# Patient Record
Sex: Male | Born: 1995 | Race: White | Hispanic: No | Marital: Single | Smoking: Never smoker
Health system: Southern US, Community
[De-identification: ages and names within clinical notes are randomized; demographics above are authoritative.]

## PROBLEM LIST (undated history)

## (undated) DIAGNOSIS — F319 Bipolar disorder, unspecified: Secondary | ICD-10-CM

## (undated) HISTORY — PX: APPENDECTOMY: SHX54

---
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
## 2018-03-02 ENCOUNTER — Emergency Department (HOSPITAL_COMMUNITY)
Admission: EM | Admit: 2018-03-02 | Discharge: 2018-03-03 | Disposition: A | Discharge status: Home / Self Care | Payer: BC Managed Care – PPO | Attending: Emergency Medicine | Admitting: Emergency Medicine

## 2018-03-02 ENCOUNTER — Encounter (HOSPITAL_COMMUNITY): Payer: Self-pay | Admitting: Emergency Medicine

## 2018-03-02 ENCOUNTER — Other Ambulatory Visit: Payer: Self-pay

## 2018-03-02 DIAGNOSIS — F331 Major depressive disorder, recurrent, moderate: Secondary | ICD-10-CM | POA: Diagnosis not present

## 2018-03-02 DIAGNOSIS — F6381 Intermittent explosive disorder: Secondary | ICD-10-CM | POA: Diagnosis present

## 2018-03-02 DIAGNOSIS — F919 Conduct disorder, unspecified: Secondary | ICD-10-CM | POA: Diagnosis not present

## 2018-03-02 DIAGNOSIS — Z79899 Other long term (current) drug therapy: Secondary | ICD-10-CM | POA: Diagnosis not present

## 2018-03-02 DIAGNOSIS — Z7982 Long term (current) use of aspirin: Secondary | ICD-10-CM | POA: Insufficient documentation

## 2018-03-02 LAB — ACETAMINOPHEN LEVEL

## 2018-03-02 LAB — ETHANOL: Alcohol, Ethyl (B): <10 mg/dL (ref <10)

## 2018-03-02 LAB — COMPREHENSIVE METABOLIC PANEL
ALT: 13 U/L (ref 0–44)
ANION GAP: 12 (ref 5–15)
AST: 19 U/L (ref 15–41)
Albumin: 5.4 g/dL — ABNORMAL HIGH (ref 3.5–5.0)
Alkaline Phosphatase: 85 U/L (ref 38–126)
BUN: 11 mg/dL (ref 6–20)
CHLORIDE: 106 mmol/L (ref 98–111)
CO2: 24 mmol/L (ref 22–32)
Calcium: 10.5 mg/dL — ABNORMAL HIGH (ref 8.9–10.3)
Creatinine, Ser: 0.8 mg/dL (ref 0.61–1.24)
GFR calc non Af Amer: >60 mL/min (ref >60)
Glucose, Bld: 102 mg/dL — ABNORMAL HIGH (ref 70–99)
Potassium: 3.9 mmol/L (ref 3.5–5.1)
SODIUM: 142 mmol/L (ref 135–145)
Total Bilirubin: 1.3 mg/dL — ABNORMAL HIGH (ref 0.3–1.2)
Total Protein: 8.5 g/dL — ABNORMAL HIGH (ref 6.5–8.1)

## 2018-03-02 LAB — CBC
HCT: 46.7 % (ref 39.0–52.0)
Hemoglobin: 15.8 g/dL (ref 13.0–17.0)
MCH: 31.5 pg (ref 26.0–34.0)
MCHC: 33.8 g/dL (ref 30.0–36.0)
MCV: 93 fL (ref 80.0–100.0)
NRBC: 0 % (ref 0.0–0.2)
PLATELETS: 286 10*3/uL (ref 150–400)
RBC: 5.02 MIL/uL (ref 4.22–5.81)
RDW: 11.7 % (ref 11.5–15.5)
WBC: 16.9 10*3/uL — ABNORMAL HIGH (ref 4.0–10.5)

## 2018-03-02 LAB — RAPID URINE DRUG SCREEN, HOSP PERFORMED
AMPHETAMINES: NOT DETECTED
BENZODIAZEPINES: NOT DETECTED
Barbiturates: NOT DETECTED
Cocaine: NOT DETECTED
Opiates: NOT DETECTED
Tetrahydrocannabinol: POSITIVE — AB

## 2018-03-02 LAB — SALICYLATE LEVEL

## 2018-03-02 NOTE — BH Assessment (Deleted)
Assessment Note  Vincent Michael is an 22 y.o. male presenting via [facility] under IVC.   Diagnosis:   Past Medical History:  Past Medical History:  Diagnosis Date  . Bipolar 1 disorder [facility])     Past Surgical History:  Procedure Laterality Date  . APPENDECTOMY      Family History: History reviewed. No pertinent family history.  Social History:  reports that he has never smoked. He has never used smokeless tobacco. He reports that he does not drink alcohol. His drug history is not on file.  Additional Social History:     CIWA: CIWA-Ar BP: 128/81 Pulse Rate: (!) 118 COWS:    Allergies: No Known Allergies  Home Medications:  (Not in a hospital admission)  OB/GYN Status:  No LMP for male patient.                                                               Disposition:     On Site Evaluation by:   Reviewed with Physician:    Celedonio Miyamoto 03/02/2018 5:27 PM
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
## 2018-03-02 NOTE — ED Triage Notes (Signed)
Pt arrived voluntary by [employer] while mother takes out IVC papers.   Pt has been destructing his own belongings and is distraught over his invisiline braces.  His mother stated that pt wants to harm himself.
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
## 2018-03-02 NOTE — ED Notes (Signed)
Pt denies any pain at this time. Pt denies any si,hi, or avh. Pt resting quietly in bed, will continue to monitor.

## 2018-03-03 DIAGNOSIS — F6381 Intermittent explosive disorder: Secondary | ICD-10-CM | POA: Diagnosis present

## 2018-03-03 DIAGNOSIS — F919 Conduct disorder, unspecified: Secondary | ICD-10-CM
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
## 2018-03-03 NOTE — ED Notes (Addendum)
Written dc instructions reviewed with pt. Pt denies si/hi/avh on dc. Pt encouraged to follow up with OP referrals provided for treatment, and to return/seek treatment for changes in thoughts.  Pt verbalized understanding.  Pt ambulatory w/o difficulty to dc area, belongings returned after leaving the unit.  Per pt his father is going to pick him up.

## 2018-03-03 NOTE — ED Notes (Signed)
Pt declined to eat breakfast reported he has invisaline in and has to brush his teeth after eating.  Pt declined toothbrush/tooth paste.

## 2018-03-03 NOTE — ED Notes (Signed)
On the phone to call, for ride home, pt to be dc'd per Dr Sharma Covert.
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
## 2018-03-03 NOTE — Discharge Instructions (Signed)
For your behavioral health needs, you are advised to follow up with an outpatient therapist.  Contact one of the following providers at your earliest opportunity to ask about scheduling an intake appointment: ° °     [facility] °     [address] °     [place], [place] [address] °     [phone] ° °     [facility] [address] °     [address] °     [place], [place] [address] °     [phone] ° °     [facility] °     [address] °     [place], [place] [address] °     [phone] °

## 2018-03-03 NOTE — ED Notes (Signed)
IVC has been rescinded per Tom CSW. 

## 2018-03-03 NOTE — BH Assessment (Signed)
[facility] Assessment Progress Note  Per Juanetta Beets, DO, this pt does not require psychiatric hospitalization at this time.  Pt presents under IVC initiated by pt's mother, which Dr Sharma Covert has rescinded.  Pt is to be discharged from [facility] with recommendation to follow up with an outpatient therapist.  Referral information for several area providers has been included in pt's discharge instructions.  Pt's nurse, Wille Celeste, has been notified.  Doylene Canning, MA Triage Specialist [phone]
